# Patient Record
Sex: Male | Born: 1990 | Race: Black or African American | Hispanic: No | Marital: Single | Smoking: Former smoker
Health system: Southern US, Community
[De-identification: ages and names within clinical notes are randomized; demographics above are authoritative.]

---
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
## 2012-04-29 ENCOUNTER — Emergency Department: Payer: Self-pay | Admitting: Emergency Medicine

## 2012-06-05 ENCOUNTER — Emergency Department: Payer: Self-pay | Admitting: Emergency Medicine

## 2012-06-12 ENCOUNTER — Emergency Department: Payer: Self-pay | Admitting: Emergency Medicine

## 2012-06-12 LAB — URINALYSIS, COMPLETE
Bilirubin,UR: NEGATIVE
Blood: NEGATIVE
Leukocyte Esterase: NEGATIVE
Nitrite: NEGATIVE
Protein: 30
RBC,UR: 5 /HPF (ref 0–5)
Squamous Epithelial: NONE SEEN
Transitional Epi: <1

## 2013-11-20 ENCOUNTER — Emergency Department: Payer: Self-pay | Admitting: Emergency Medicine

## 2014-02-06 ENCOUNTER — Emergency Department: Payer: Self-pay | Admitting: Emergency Medicine

## 2014-02-06 LAB — CBC
HCT: 49.3 % (ref 40.0–52.0)
HGB: 16.6 g/dL (ref 13.0–18.0)
MCH: 29.3 pg (ref 26.0–34.0)
MCHC: 33.6 g/dL (ref 32.0–36.0)
MCV: 87 fL (ref 80–100)
Platelet: 266 10*3/uL (ref 150–440)
RBC: 5.64 10*6/uL (ref 4.40–5.90)
RDW: 13.4 % (ref 11.5–14.5)
WBC: 5.3 10*3/uL (ref 3.8–10.6)

## 2014-02-06 LAB — COMPREHENSIVE METABOLIC PANEL
ALBUMIN: 4.3 g/dL (ref 3.4–5.0)
ANION GAP: 5 — AB (ref 7–16)
AST: 22 U/L (ref 15–37)
Alkaline Phosphatase: 108 U/L
BUN: 8 mg/dL (ref 7–18)
Bilirubin,Total: 0.4 mg/dL (ref 0.2–1.0)
CALCIUM: 8.7 mg/dL (ref 8.5–10.1)
Chloride: 109 mmol/L — ABNORMAL HIGH (ref 98–107)
Co2: 25 mmol/L (ref 21–32)
Creatinine: 0.97 mg/dL (ref 0.60–1.30)
EGFR (African American): >60
EGFR (Non-African Amer.): >60
GLUCOSE: 113 mg/dL — AB (ref 65–99)
Osmolality: 277 (ref 275–301)
Potassium: 3.6 mmol/L (ref 3.5–5.1)
SGPT (ALT): 30 U/L (ref 12–78)
Sodium: 139 mmol/L (ref 136–145)
TOTAL PROTEIN: 8.1 g/dL (ref 6.4–8.2)

## 2014-02-06 LAB — ETHANOL
ETHANOL LVL: 243 mg/dL
Ethanol %: 0.243 % — ABNORMAL HIGH (ref 0.000–0.080)

## 2014-02-06 LAB — LIPASE, BLOOD: Lipase: 82 U/L (ref 73–393)

## 2015-06-18 ENCOUNTER — Emergency Department
Admission: EM | Admit: 2015-06-18 | Discharge: 2015-06-18 | Disposition: A | Discharge status: Home / Self Care | Payer: Self-pay | Attending: Emergency Medicine | Admitting: Emergency Medicine

## 2015-06-18 ENCOUNTER — Encounter: Payer: Self-pay | Admitting: Emergency Medicine

## 2015-06-18 DIAGNOSIS — Z87891 Personal history of nicotine dependence: Secondary | ICD-10-CM | POA: Insufficient documentation

## 2015-06-18 DIAGNOSIS — L0201 Cutaneous abscess of face: Secondary | ICD-10-CM | POA: Insufficient documentation

## 2015-06-18 MED ORDER — IBUPROFEN 800 MG PO TABS
800.0000 mg | ORAL_TABLET | Freq: Once | ORAL | Status: AC
  Administered 2015-06-18: 800 mg via ORAL

## 2015-06-18 MED ORDER — TRAMADOL HCL 50 MG PO TABS
ORAL_TABLET | ORAL | Status: AC
  Administered 2015-06-18: 50 mg via ORAL
  Filled 2015-06-18: qty 1

## 2015-06-18 MED ORDER — IBUPROFEN 800 MG PO TABS: 800.0000 mg | ORAL_TABLET | Freq: Three times a day (TID) | ORAL | Status: AC | PRN

## 2015-06-18 MED ORDER — SULFAMETHOXAZOLE-TRIMETHOPRIM 800-160 MG PO TABS: 1.0000 | ORAL_TABLET | Freq: Two times a day (BID) | ORAL | Status: AC

## 2015-06-18 MED ORDER — TRAMADOL HCL 50 MG PO TABS
50.0000 mg | ORAL_TABLET | Freq: Once | ORAL | Status: AC
  Administered 2015-06-18: 50 mg via ORAL

## 2015-06-18 MED ORDER — TRAMADOL HCL 50 MG PO TABS: 50.0000 mg | ORAL_TABLET | Freq: Four times a day (QID) | ORAL | Status: AC | PRN

## 2015-06-18 MED ORDER — SULFAMETHOXAZOLE-TRIMETHOPRIM 800-160 MG PO TABS
ORAL_TABLET | ORAL | Status: AC
  Administered 2015-06-18: 1 via ORAL
  Filled 2015-06-18: qty 1

## 2015-06-18 MED ORDER — IBUPROFEN 800 MG PO TABS
ORAL_TABLET | ORAL | Status: AC
  Administered 2015-06-18: 800 mg via ORAL
  Filled 2015-06-18: qty 1

## 2015-06-18 MED ORDER — SULFAMETHOXAZOLE-TRIMETHOPRIM 800-160 MG PO TABS
1.0000 | ORAL_TABLET | Freq: Two times a day (BID) | ORAL | Status: DC
  Administered 2015-06-18: 1 via ORAL

## 2015-06-18 NOTE — ED Notes (Addendum)
Redden area noted to chin with white drainage that started 2 days ago. Denies fever

## 2015-06-18 NOTE — ED Notes (Signed)
NAD noted at time of D/C. Pt denies questions or concerns. Pt ambulatory to the lobby at this time.  

## 2015-06-18 NOTE — Discharge Instructions (Signed)
Abscess °An abscess is an infected area that contains a collection of pus and debris. It can occur in almost any part of the body. An abscess is also known as a furuncle or boil. °CAUSES  °An abscess occurs when tissue gets infected. This can occur from blockage of oil or sweat glands, infection of hair follicles, or a minor injury to the skin. As the body tries to fight the infection, pus collects in the area and creates pressure under the skin. This pressure causes pain. People with weakened immune systems have difficulty fighting infections and get certain abscesses more often.  °SYMPTOMS °Usually an abscess develops on the skin and becomes a painful mass that is red, warm, and tender. If the abscess forms under the skin, you may feel a moveable soft area under the skin. Some abscesses break open (rupture) on their own, but most will continue to get worse without care. The infection can spread deeper into the body and eventually into the bloodstream, causing you to feel ill.  °DIAGNOSIS  °Your caregiver will take your medical history and perform a physical exam. A sample of fluid may also be taken from the abscess to determine what is causing your infection. °TREATMENT  °Your caregiver may prescribe antibiotic medicines to fight the infection. However, taking antibiotics alone usually does not cure an abscess. Your caregiver may need to make a small cut (incision) in the abscess to drain the pus. In some cases, gauze is packed into the abscess to reduce pain and to continue draining the area. °HOME CARE INSTRUCTIONS  °· Only take over-the-counter or prescription medicines for pain, discomfort, or fever as directed by your caregiver. °· If you were prescribed antibiotics, take them as directed. Finish them even if you start to feel better. °· If gauze is used, follow your caregiver's directions for changing the gauze. °· To avoid spreading the infection: °· Keep your draining abscess covered with a  bandage. °· Wash your hands well. °· Do not share personal care items, towels, or whirlpools with others. °· Avoid skin contact with others. °· Keep your skin and clothes clean around the abscess. °· Keep all follow-up appointments as directed by your caregiver. °SEEK MEDICAL CARE IF:  °· You have increased pain, swelling, redness, fluid drainage, or bleeding. °· You have muscle aches, chills, or a general ill feeling. °· You have a fever. °MAKE SURE YOU:  °· Understand these instructions. °· Will watch your condition. °· Will get help right away if you are not doing well or get worse. °Document Released: 09/26/2005 Document Revised: 06/17/2012 Document Reviewed: 02/29/2012 °ExitCare® Patient Information ©2015 [employer]. This information is not intended to replace advice given to you by your health care provider. Make sure you discuss any questions you have with your health care provider. ° °Abscess °Care After °An abscess (also called a boil or furuncle) is an infected area that contains a collection of pus. Signs and symptoms of an abscess include pain, tenderness, redness, or hardness, or you may feel a moveable soft area under your skin. An abscess can occur anywhere in the body. The infection may spread to surrounding tissues causing cellulitis. A cut (incision) by the surgeon was made over your abscess and the pus was drained out. Gauze may have been packed into the space to provide a drain that will allow the cavity to heal from the inside outwards. The boil may be painful for 5 to 7 days. Most people with a boil do not have   high fevers. Your abscess, if seen early, may not have localized, and may not have been lanced. If not, another appointment may be required for this if it does not get better on its own or with medications. °HOME CARE INSTRUCTIONS  °· Only take over-the-counter or prescription medicines for pain, discomfort, or fever as directed by your caregiver. °· When you bathe, soak and then  remove gauze or iodoform packs at least daily or as directed by your caregiver. You may then wash the wound gently with mild soapy water. Repack with gauze or do as your caregiver directs. °SEEK IMMEDIATE MEDICAL CARE IF:  °· You develop increased pain, swelling, redness, drainage, or bleeding in the wound site. °· You develop signs of generalized infection including muscle aches, chills, fever, or a general ill feeling. °· An oral temperature above 102° F (38.9° C) develops, not controlled by medication. °See your caregiver for a recheck if you develop any of the symptoms described above. If medications (antibiotics) were prescribed, take them as directed. °Document Released: 07/05/2005 Document Revised: 03/10/2012 Document Reviewed: 03/01/2008 °ExitCare® Patient Information ©2015 [employer]. This information is not intended to replace advice given to you by your health care provider. Make sure you discuss any questions you have with your health care provider. ° °

## 2015-06-18 NOTE — ED Provider Notes (Signed)
[facility] Emergency Department Provider Note  ____________________________________________  Time seen: 1710 I have reviewed the triage vital signs and the nursing notes.   HISTORY  Chief Complaint Abscess    HPI Joel Kirk is a 24 y.o. male since here today with a abscess on his chin states that he had a bump he squeezed it now his chin is swollen scabbed over nothing more is coming out of it and is here today thinking that he may have to get it cut open says it hurts rates it about 8 out of 10 nothing making empirically better or worse denies any other complaints at this time fevers chills nausea any trauma   History reviewed. No pertinent past medical history.  There are no active problems to display for this patient.   History reviewed. No pertinent past surgical history.  Current Outpatient Rx  Name  Route  Sig  Dispense  Refill  . ibuprofen (ADVIL,MOTRIN) 800 MG tablet   Oral   Take 1 tablet (800 mg total) by mouth every 8 (eight) hours as needed.   30 tablet   0   . sulfamethoxazole-trimethoprim (BACTRIM DS,SEPTRA DS) 800-160 MG per tablet   Oral   Take 1 tablet by mouth 2 (two) times daily.   14 tablet   0   . traMADol (ULTRAM) 50 MG tablet   Oral   Take 1 tablet (50 mg total) by mouth every 6 (six) hours as needed.   12 tablet   0     Allergies Review of patient's allergies indicates no known allergies.  No family history on file.  Social History History  Substance Use Topics  . Smoking status: Former Games developer  . Smokeless tobacco: Never Used  . Alcohol Use: Yes    Review of Systems Constitutional: No fever/chills Eyes: No visual changes. ENT: No sore throat. Cardiovascular: Denies chest pain. Respiratory: Denies shortness of breath. Gastrointestinal: No abdominal pain.  No nausea, no vomiting.  No diarrhea.  No constipation. Genitourinary: Negative for dysuria. Musculoskeletal: Negative for back pain. Skin:  Negative for rash. Neurological: Negative for headaches, focal weakness or numbness.  10-point ROS otherwise negative. Except for noted in the history of present illness  ____________________________________________   PHYSICAL EXAM:  VITAL SIGNS: ED Triage Vitals  Enc Vitals Group     BP 06/18/15 1658 140/74 mmHg     Pulse Rate 06/18/15 1658 82     Resp 06/18/15 1658 18     Temp 06/18/15 1658 98.7 F (37.1 C)     Temp Source 06/18/15 1658 Oral     SpO2 06/18/15 1658 97 %     Weight 06/18/15 1658 165 lb (74.844 kg)     Height 06/18/15 1658  (1.753 m)     Head Cir --      Peak Flow --      Pain Score 06/18/15 1700 8     Pain Loc --      Pain Edu? --      Excl. in GC? --     Constitutional: Alert and oriented. Well appearing and in no acute distress. Eyes: Conjunctivae are normal. PERRL. EOMI. Head: Atraumatic. Nose: No congestion/rhinnorhea. Mouth/Throat: Mucous membranes are moist.  Oropharynx non-erythematous. Neck: No stridor.   Cardiovascular: Normal rate, regular rhythm. Grossly normal heart sounds.  Good peripheral circulation. Respiratory: Normal respiratory effort.  No retractions. Lungs CTAB.  Musculoskeletal: No lower extremity tenderness nor edema.  No joint effusions. Neurologic:  Normal speech  and language. No gross focal neurologic deficits are appreciated. Speech is normal. No gait instability. Skin:  Skin is warm, dry and intact. Patient has swelling to the left side of his chin clear drainage redness and mild fluctuants scabbed over Psychiatric: Mood and affect are normal. Speech and behavior are normal.  ____________________________________________     PROCEDURES  Procedure(s) performed: None  Critical Care performed: No  ____________________________________________   INITIAL IMPRESSION / ASSESSMENT AND PLAN / ED COURSE  Pertinent labs & imaging results that were available during my care of the patient were reviewed by me and considered  in my medical decision making (see chart for details).  Area was cleaned with alcohol scab was removed with mild palpation of fair amount. Material was discharged look to be an ingrown hair and start the patient on Tobi Bastos biotics recommend warm compresses keep area clean return here for any acute concerns or worsening symptoms ____________________________________________   FINAL CLINICAL IMPRESSION(S) / ED DIAGNOSES  Final diagnoses:  Facial abscess     Shawnte Winton Rosalyn Gess, PA-C 06/18/15 1811  Sharyn Creamer, MD 06/19/15 0013

## 2015-10-07 ENCOUNTER — Emergency Department
Admission: EM | Admit: 2015-10-07 | Discharge: 2015-10-07 | Disposition: A | Discharge status: Home / Self Care | Payer: Self-pay | Attending: Emergency Medicine | Admitting: Emergency Medicine

## 2015-10-07 ENCOUNTER — Encounter: Payer: Self-pay | Admitting: Emergency Medicine

## 2015-10-07 DIAGNOSIS — A084 Viral intestinal infection, unspecified: Secondary | ICD-10-CM | POA: Insufficient documentation

## 2015-10-07 DIAGNOSIS — Z87891 Personal history of nicotine dependence: Secondary | ICD-10-CM | POA: Insufficient documentation

## 2015-10-07 DIAGNOSIS — Z79899 Other long term (current) drug therapy: Secondary | ICD-10-CM | POA: Insufficient documentation

## 2015-10-07 LAB — CBC
HEMATOCRIT: 48.8 % (ref 40.0–52.0)
HEMOGLOBIN: 16.3 g/dL (ref 13.0–18.0)
MCH: 27.4 pg (ref 26.0–34.0)
MCHC: 33.4 g/dL (ref 32.0–36.0)
MCV: 82 fL (ref 80.0–100.0)
Platelets: 297 10*3/uL (ref 150–440)
RBC: 5.95 MIL/uL — ABNORMAL HIGH (ref 4.40–5.90)
RDW: 12.9 % (ref 11.5–14.5)
WBC: 9.6 10*3/uL (ref 3.8–10.6)

## 2015-10-07 LAB — URINALYSIS COMPLETE WITH MICROSCOPIC (ARMC ONLY)
BACTERIA UA: NONE SEEN
Bilirubin Urine: NEGATIVE
Glucose, UA: NEGATIVE mg/dL
Hgb urine dipstick: NEGATIVE
Leukocytes, UA: NEGATIVE
Nitrite: NEGATIVE
PH: 5 (ref 5.0–8.0)
Protein, ur: 100 mg/dL — AB
RBC / HPF: NONE SEEN RBC/hpf (ref 0–5)
SQUAMOUS EPITHELIAL / LPF: NONE SEEN
Specific Gravity, Urine: 1.029 (ref 1.005–1.030)

## 2015-10-07 LAB — COMPREHENSIVE METABOLIC PANEL
ALBUMIN: 5.4 g/dL — AB (ref 3.5–5.0)
ALT: 21 U/L (ref 17–63)
ANION GAP: 16 — AB (ref 5–15)
AST: 20 U/L (ref 15–41)
Alkaline Phosphatase: 101 U/L (ref 38–126)
BUN: 12 mg/dL (ref 6–20)
CHLORIDE: 103 mmol/L (ref 101–111)
CO2: 17 mmol/L — AB (ref 22–32)
Calcium: 9.9 mg/dL (ref 8.9–10.3)
Creatinine, Ser: 0.91 mg/dL (ref 0.61–1.24)
GFR calc non Af Amer: >60 mL/min (ref >60)
GLUCOSE: 83 mg/dL (ref 65–99)
Potassium: 4.4 mmol/L (ref 3.5–5.1)
SODIUM: 136 mmol/L (ref 135–145)
Total Bilirubin: 2.1 mg/dL — ABNORMAL HIGH (ref 0.3–1.2)
Total Protein: 8.3 g/dL — ABNORMAL HIGH (ref 6.5–8.1)

## 2015-10-07 LAB — LIPASE, BLOOD: LIPASE: 19 U/L — AB (ref 22–51)

## 2015-10-07 MED ORDER — ONDANSETRON 4 MG PO TBDP
4.0000 mg | ORAL_TABLET | Freq: Once | ORAL | Status: AC
  Administered 2015-10-07: 4 mg via ORAL
  Filled 2015-10-07: qty 1

## 2015-10-07 MED ORDER — ONDANSETRON 4 MG PO TBDP: 4.0000 mg | ORAL_TABLET | Freq: Three times a day (TID) | ORAL | Status: AC | PRN

## 2015-10-07 MED ORDER — SODIUM CHLORIDE 0.9 % IV BOLUS (SEPSIS)
1000.0000 mL | Freq: Once | INTRAVENOUS | Status: AC
  Administered 2015-10-07: 1000 mL via INTRAVENOUS

## 2015-10-07 MED ORDER — ONDANSETRON HCL 4 MG/2ML IJ SOLN
4.0000 mg | Freq: Once | INTRAMUSCULAR | Status: AC
  Administered 2015-10-07: 4 mg via INTRAVENOUS
  Filled 2015-10-07: qty 2

## 2015-10-07 NOTE — Discharge Instructions (Signed)
Viral Gastroenteritis Viral gastroenteritis is also called stomach flu. This illness is caused by a certain type of germ (virus). It can cause sudden watery poop (diarrhea) and throwing up (vomiting). This can cause you to lose body fluids (dehydration). This illness usually lasts for 3 to 8 days. It usually goes away on its own. HOME CARE   Drink enough fluids to keep your pee (urine) clear or pale yellow. Drink small amounts of fluids often.  Ask your doctor how to replace body fluid losses (rehydration).  Avoid:  Foods high in sugar.  Alcohol.  Bubbly (carbonated) drinks.  Tobacco.  Juice.  Caffeine drinks.  Very hot or cold fluids.  Fatty, greasy foods.  Eating too much at one time.  Dairy products until 24 to 48 hours after your watery poop stops.  You may eat foods with active cultures (probiotics). They can be found in some yogurts and supplements.  Wash your hands well to avoid spreading the illness.  Only take medicines as told by your doctor. Do not give aspirin to children. Do not take medicines for watery poop (antidiarrheals).  Ask your doctor if you should keep taking your regular medicines.  Keep all doctor visits as told. GET HELP RIGHT AWAY IF:   You cannot keep fluids down.  You do not pee at least once every 6 to 8 hours.  You are short of breath.  You see blood in your poop or throw up. This may look like coffee grounds.  You have belly (abdominal) pain that gets worse or is just in one small spot (localized).  You keep throwing up or having watery poop.  You have a fever.  The patient is a child younger than 3 months, and he or she has a fever.  The patient is a child older than 3 months, and he or she has a fever and problems that do not go away.  The patient is a child older than 3 months, and he or she has a fever and problems that suddenly get worse.  The patient is a baby, and he or she has no tears when crying. MAKE SURE YOU:     Understand these instructions.  Will watch your condition.  Will get help right away if you are not doing well or get worse.   This information is not intended to replace advice given to you by your health care provider. Make sure you discuss any questions you have with your health care provider.   Document Released: 06/04/2008 Document Revised: 03/10/2012 Document Reviewed: 10/03/2011 Elsevier Interactive Patient Education 2016 [employer]     Clear liquids for the next 24 hours. Safely as needed for nausea. Follow-up with your doctor or return to the emergency room if any continued symptoms. He may also take Tylenol if needed for fever or aches.

## 2015-10-07 NOTE — ED Notes (Signed)
Pt to ed with c/o n/v/d since yesterday.

## 2015-10-07 NOTE — ED Provider Notes (Signed)
[facility] Emergency Department Provider Note  ____________________________________________  Time seen: Approximately 3:33 PM  I have reviewed the triage vital signs and the nursing notes.   HISTORY  Chief Complaint Emesis  HPI Joel Kirk is a 24 y.o. male is here complaining of nausea, vomiting, and diarrhea since yesterday.Patient states that he works night shift and ate dinner approximately 2:30 AM. At 5 AM today he began having vomiting and vomited 4-5 times prior to going to sleep. When he awoke he continued to have nausea and vomiting. He has had diarrhea 2-3 times today. He denies any other family members having same symptoms. He is unaware of any fever or chills. He has not taken any over-the-counter medication and has not eaten since he woke up his afternoon. Currently he rates his discomfort as 8 out of 10. He denies any abdominal pain but "still feels nauseous".   History reviewed. No pertinent past medical history.  There are no active problems to display for this patient.   History reviewed. No pertinent past surgical history.  Current Outpatient Rx  Name  Route  Sig  Dispense  Refill  . ibuprofen (ADVIL,MOTRIN) 800 MG tablet   Oral   Take 1 tablet (800 mg total) by mouth every 8 (eight) hours as needed.   30 tablet   0   . ondansetron (ZOFRAN ODT) 4 MG disintegrating tablet   Oral   Take 1 tablet (4 mg total) by mouth every 8 (eight) hours as needed for nausea or vomiting.   20 tablet   0   . sulfamethoxazole-trimethoprim (BACTRIM DS,SEPTRA DS) 800-160 MG per tablet   Oral   Take 1 tablet by mouth 2 (two) times daily.   14 tablet   0   . traMADol (ULTRAM) 50 MG tablet   Oral   Take 1 tablet (50 mg total) by mouth every 6 (six) hours as needed.   12 tablet   0     Allergies Review of patient's allergies indicates no known allergies.  History reviewed. No pertinent family history.  Social History Social History   Substance Use Topics  . Smoking status: Former Games developer  . Smokeless tobacco: Never Used  . Alcohol Use: Yes    Review of Systems Constitutional: No fever/chills ENT: Mildly positive sore throat. Cardiovascular: Denies chest pain. Respiratory: Denies shortness of breath. Gastrointestinal: No abdominal pain.  Positive nausea, positive vomiting.  Positive diarrhea.  No constipation. Genitourinary: Negative for dysuria. Musculoskeletal: Negative for back pain. Skin: Negative for rash. Neurological: Negative for headaches, focal weakness or numbness.  10-point ROS otherwise negative.  ____________________________________________   PHYSICAL EXAM:  VITAL SIGNS: ED Triage Vitals  Enc Vitals Group     BP 10/07/15 1449 151/87 mmHg     Pulse Rate 10/07/15 1449 92     Resp 10/07/15 1449 20     Temp 10/07/15 1449 98.5 F (36.9 C)     Temp Source 10/07/15 1449 Oral     SpO2 10/07/15 1449 97 %     Weight 10/07/15 1449 165 lb (74.844 kg)     Height 10/07/15 1449  (1.753 m)     Head Cir --      Peak Flow --      Pain Score 10/07/15 1449 8     Pain Loc --      Pain Edu? --      Excl. in GC? --     Constitutional: Alert and oriented. Well appearing and  in no acute distress. Eyes: Conjunctivae are normal. PERRL. EOMI. Head: Atraumatic. Nose: No congestion/rhinnorhea. Mouth/Throat: Mucous membranes are moist.  Oropharynx non-erythematous. Neck: No stridor.   Cardiovascular: Normal rate, regular rhythm. Grossly normal heart sounds.  Good peripheral circulation. Respiratory: Normal respiratory effort.  No retractions. Lungs CTAB. Gastrointestinal: Soft and nontender. No distention. No abdominal bruits. No CVA tenderness. Bowel sounds at present are hypoactive. Musculoskeletal: No lower extremity tenderness nor edema.  No joint effusions. Neurologic:  Normal speech and language. No gross focal neurologic deficits are appreciated. No gait instability. Skin:  Skin is warm, dry and  intact. No rash noted. Psychiatric: Mood and affect are normal. Speech and behavior are normal.  ____________________________________________   LABS (all labs ordered are listed, but only abnormal results are displayed)  Labs Reviewed  LIPASE, BLOOD - Abnormal; Notable for the following:    Lipase 19 (*)    All other components within normal limits  COMPREHENSIVE METABOLIC PANEL - Abnormal; Notable for the following:    CO2 17 (*)    Total Protein 8.3 (*)    Albumin 5.4 (*)    Total Bilirubin 2.1 (*)    Anion gap 16 (*)    All other components within normal limits  CBC - Abnormal; Notable for the following:    RBC 5.95 (*)    All other components within normal limits  URINALYSIS COMPLETEWITH MICROSCOPIC ([facility] ONLY) - Abnormal; Notable for the following:    Color, Urine YELLOW (*)    APPearance CLEAR (*)    Ketones, ur 2+ (*)    Protein, ur 100 (*)    All other components within normal limits     PROCEDURES  Procedure(s) performed: None  Critical Care performed: No  ____________________________________________   INITIAL IMPRESSION / ASSESSMENT AND PLAN / ED COURSE  Pertinent labs & imaging results that were available during my care of the patient were reviewed by me and considered in my medical decision making (see chart for details).  Patient got a bolus of normal saline along with Zofran. Later he was able to drink fluids with out any nausea. Patient was discharged on Zofran. He is also given a note for work. He is return to the emergency room if any severe worsening of his symptoms or inability to take his medication. ____________________________________________   FINAL CLINICAL IMPRESSION(S) / ED DIAGNOSES  Final diagnoses:  Viral gastroenteritis      Tommi Rumps, PA-C 10/07/15 1744  Richardean Canal, MD 10/11/15 [phone]

## 2016-10-19 ENCOUNTER — Emergency Department: Payer: Self-pay

## 2016-10-19 ENCOUNTER — Encounter: Payer: Self-pay | Admitting: *Deleted

## 2016-10-19 ENCOUNTER — Emergency Department
Admission: EM | Admit: 2016-10-19 | Discharge: 2016-10-19 | Disposition: A | Discharge status: Home / Self Care | Payer: Self-pay | Attending: Emergency Medicine | Admitting: Emergency Medicine

## 2016-10-19 DIAGNOSIS — Y929 Unspecified place or not applicable: Secondary | ICD-10-CM | POA: Insufficient documentation

## 2016-10-19 DIAGNOSIS — Y9389 Activity, other specified: Secondary | ICD-10-CM | POA: Insufficient documentation

## 2016-10-19 DIAGNOSIS — Z87891 Personal history of nicotine dependence: Secondary | ICD-10-CM | POA: Insufficient documentation

## 2016-10-19 DIAGNOSIS — Z79899 Other long term (current) drug therapy: Secondary | ICD-10-CM | POA: Insufficient documentation

## 2016-10-19 DIAGNOSIS — Y990 Civilian activity done for income or pay: Secondary | ICD-10-CM | POA: Insufficient documentation

## 2016-10-19 DIAGNOSIS — W208XXA Other cause of strike by thrown, projected or falling object, initial encounter: Secondary | ICD-10-CM | POA: Insufficient documentation

## 2016-10-19 DIAGNOSIS — S60221A Contusion of right hand, initial encounter: Secondary | ICD-10-CM | POA: Insufficient documentation

## 2016-10-19 MED ORDER — IBUPROFEN 800 MG PO TABS
800.0000 mg | ORAL_TABLET | Freq: Once | ORAL | Status: AC
  Administered 2016-10-19: 800 mg via ORAL
  Filled 2016-10-19: qty 1

## 2016-10-19 MED ORDER — IBUPROFEN 800 MG PO TABS: 800.0000 mg | ORAL_TABLET | Freq: Three times a day (TID) | ORAL | 0 refills | Status: AC | PRN

## 2016-10-19 NOTE — ED Triage Notes (Signed)
Pt was working and a piece of wood fell and smashed his right hand. C/o right hand pain and swelling. Took tylenol around 3pm, with some relief.

## 2016-10-19 NOTE — ED Provider Notes (Signed)
[facility] Emergency Department Provider Note    First MD Initiated Contact with Patient 10/19/16 [phone]     (approximate)  I have reviewed the triage vital signs and the nursing notes.   HISTORY  Chief Complaint Hand Pain    HPI Joel Kirk is a 25 y.o. male resents with right hand pain after being struck on the posterior portion of the right hand by a large piece of wood. Patient states that he took Tylenol approximately 3 PM this afternoon with some relief.   Past medical history None There are no active problems to display for this patient.  Past surgical history None  Prior to Admission medications   Medication Sig Start Date End Date Taking? Authorizing Provider  ibuprofen (ADVIL,MOTRIN) 800 MG tablet Take 1 tablet (800 mg total) by mouth every 8 (eight) hours as needed. 06/18/15   III William C Ruffian, PA-C  ondansetron (ZOFRAN ODT) 4 MG disintegrating tablet Take 1 tablet (4 mg total) by mouth every 8 (eight) hours as needed for nausea or vomiting. 10/07/15   Tommi Rumps, PA-C  sulfamethoxazole-trimethoprim (BACTRIM DS,SEPTRA DS) 800-160 MG per tablet Take 1 tablet by mouth 2 (two) times daily. 06/18/15   III Kristine Garbe Ruffian, PA-C  traMADol (ULTRAM) 50 MG tablet Take 1 tablet (50 mg total) by mouth every 6 (six) hours as needed. 06/18/15   III Kristine Garbe Ruffian, PA-C    Allergies No known drug allergies No family history on file.  Social History Social History  Substance Use Topics  . Smoking status: Former Games developer  . Smokeless tobacco: Never Used  . Alcohol use Yes    Review of Systems Constitutional: No fever/chills Eyes: No visual changes. ENT: No sore throat. Cardiovascular: Denies chest pain. Respiratory: Denies shortness of breath. Gastrointestinal: No abdominal pain.  No nausea, no vomiting.  No diarrhea.  No constipation. Genitourinary: Negative for dysuria. Musculoskeletal: Negative for back pain.Positive for right  hand pain Skin: Negative for rash. Neurological: Negative for headaches, focal weakness or numbness.  10-point ROS otherwise negative.  ____________________________________________   PHYSICAL EXAM:  VITAL SIGNS: ED Triage Vitals [10/19/16 0009]  Enc Vitals Group     BP 139/89     Pulse Rate 74     Resp 16     Temp 97.7 F (36.5 C)     Temp Source Oral     SpO2 100 %     Weight 155 lb (70.3 kg)     Height 5\' 9"  (1.753 m)     Head Circumference      Peak Flow      Pain Score 8     Pain Loc      Pain Edu?      Excl. in GC?     Constitutional: Alert and oriented. Well appearing and in no acute distress. Eyes: Conjunctivae are normal. PERRL. EOMI. Head: Atraumatic. Mouth/Throat: Mucous membranes are moist.  Oropharynx non-erythematous. Neck: No stridor.  No meningeal signs.   Cardiovascular: Normal rate, regular rhythm. Good peripheral circulation. Grossly normal heart sounds. Respiratory: Normal respiratory effort.  No retractions. Lungs CTAB. Musculoskeletal: Swelling noted at the right fifth metacarpal with tenderness to palpation.. Neurologic:  Normal speech and language. No gross focal neurologic deficits are appreciated.  Skin:  Skin is warm, dry and intact. No rash noted. Psychiatric: Mood and affect are normal. Speech and behavior are normal. ____________________________________________  RADIOLOGY I, Heeia N Hermione Havlicek, personally viewed and evaluated these images (plain radiographs)  as part of my medical decision making, as well as reviewing the written report by the radiologist.  Dg Hand Complete Right  Result Date: 10/19/2016 CLINICAL DATA:  25 year old male with right hand swelling and pain. EXAM: RIGHT HAND - COMPLETE 3+ VIEW COMPARISON:  None. FINDINGS: There is no acute fracture or dislocation. The bones are well mineralized. No arthritic changes. There is diffuse soft tissue swelling of the hand. No radiopaque foreign object identified. IMPRESSION: No acute  osseous pathology. Diffuse soft tissue swelling of the hand. Clinical correlation is recommended. Electronically Signed   By: Elgie CollardArash  Radparvar M.D.   On: 10/19/2016 01:28     Procedures     INITIAL IMPRESSION / ASSESSMENT AND PLAN / ED COURSE  Pertinent labs & imaging results that were available during my care of the patient were reviewed by me and considered in my medical decision making (see chart for details).  X-ray of the right hand revealed soft tissue swelling which is consistent with physical exam. However x-ray did not reveal any evidence of fracture. Ice pack applied to the patient's right hand ibuprofen 800 mg given will be prescribed same for home.   Clinical Course    ____________________________________________  FINAL CLINICAL IMPRESSION(S) / ED DIAGNOSES  Final diagnoses:  Contusion of right hand, initial encounter     MEDICATIONS GIVEN DURING THIS VISIT:  Medications  ibuprofen (ADVIL,MOTRIN) tablet 800 mg (800 mg Oral Given 10/19/16 0209)     NEW OUTPATIENT MEDICATIONS STARTED DURING THIS VISIT:  New Prescriptions   No medications on file    Modified Medications   No medications on file    Discontinued Medications   No medications on file     Note:  This document was prepared using Dragon voice recognition software and may include unintentional dictation errors.    Darci Currentandolph N Dominik Yordy, MD 10/19/16 Earle [id number]

## 2016-10-19 NOTE — ED Notes (Signed)
Ice applied to right hand.

## 2016-12-03 ENCOUNTER — Emergency Department
Admission: EM | Admit: 2016-12-03 | Discharge: 2016-12-03 | Disposition: A | Discharge status: Home / Self Care | Payer: Self-pay | Attending: Emergency Medicine | Admitting: Emergency Medicine

## 2016-12-03 ENCOUNTER — Encounter: Payer: Self-pay | Admitting: Medical Oncology

## 2016-12-03 DIAGNOSIS — Z87891 Personal history of nicotine dependence: Secondary | ICD-10-CM | POA: Insufficient documentation

## 2016-12-03 DIAGNOSIS — N390 Urinary tract infection, site not specified: Secondary | ICD-10-CM | POA: Insufficient documentation

## 2016-12-03 LAB — URINALYSIS COMPLETE WITH MICROSCOPIC (ARMC ONLY)
BILIRUBIN URINE: NEGATIVE
Bacteria, UA: NONE SEEN
GLUCOSE, UA: NEGATIVE mg/dL
HGB URINE DIPSTICK: NEGATIVE
Ketones, ur: NEGATIVE mg/dL
Nitrite: NEGATIVE
Protein, ur: NEGATIVE mg/dL
SPECIFIC GRAVITY, URINE: 1.025 (ref 1.005–1.030)
Squamous Epithelial / LPF: NONE SEEN
pH: 5 (ref 5.0–8.0)

## 2016-12-03 LAB — CHLAMYDIA/NGC RT PCR (ARMC ONLY)
Chlamydia Tr: NOT DETECTED
N GONORRHOEAE: NOT DETECTED

## 2016-12-03 MED ORDER — PHENAZOPYRIDINE HCL 200 MG PO TABS: 200.0000 mg | ORAL_TABLET | Freq: Three times a day (TID) | ORAL | 0 refills | Status: AC | PRN

## 2016-12-03 MED ORDER — SULFAMETHOXAZOLE-TRIMETHOPRIM 800-160 MG PO TABS: 1.0000 | ORAL_TABLET | Freq: Two times a day (BID) | ORAL | 0 refills | Status: AC

## 2016-12-03 NOTE — ED Notes (Signed)
Resting at present   Awaiting lab results

## 2016-12-03 NOTE — ED Triage Notes (Signed)
Pt to ed with c/o urinary frequency and burning.

## 2016-12-03 NOTE — ED Notes (Signed)
See triage note  States he is having some diff with urination denies any penile discharge  But thinks he has been exposed to STD

## 2016-12-03 NOTE — ED Provider Notes (Signed)
[facility] Emergency Department Provider Note   ____________________________________________   First MD Initiated Contact with Patient 12/03/16 [phone]     (approximate)  I have reviewed the triage vital signs and the nursing notes.   HISTORY  Chief Complaint Dysuria    HPI Clyde Canterburyyler L Corea is a 25 y.o. male patient complaining the urinary frequency and burning for 3 days. Patient say suspect STD but discomfort as test negative. Patient denies any urethral discharge. Patient denies any flank pain. Patient denies any fever.Patient rates his pain discomfort 6/10. No palliative measures for this complaint.   History reviewed. No pertinent past medical history.  There are no active problems to display for this patient.   History reviewed. No pertinent surgical history.  Prior to Admission medications   Medication Sig Start Date End Date Taking? Authorizing Provider  ibuprofen (ADVIL,MOTRIN) 800 MG tablet Take 1 tablet (800 mg total) by mouth every 8 (eight) hours as needed. 06/18/15   III William C Ruffian, PA-C  ibuprofen (ADVIL,MOTRIN) 800 MG tablet Take 1 tablet (800 mg total) by mouth every 8 (eight) hours as needed. 10/19/16   Darci Currentandolph N Brown, MD  ondansetron (ZOFRAN ODT) 4 MG disintegrating tablet Take 1 tablet (4 mg total) by mouth every 8 (eight) hours as needed for nausea or vomiting. 10/07/15   Tommi Rumpshonda L Summers, PA-C  sulfamethoxazole-trimethoprim (BACTRIM DS,SEPTRA DS) 800-160 MG per tablet Take 1 tablet by mouth 2 (two) times daily. 06/18/15   III Kristine GarbeWilliam C Ruffian, PA-C  traMADol (ULTRAM) 50 MG tablet Take 1 tablet (50 mg total) by mouth every 6 (six) hours as needed. 06/18/15   III Rosalyn GessWilliam C Ruffian, PA-C    Allergies Patient has no known allergies.  No family history on file.  Social History Social History  Substance Use Topics  . Smoking status: Former Games developermoker  . Smokeless tobacco: Never Used  . Alcohol use Yes    Review of  Systems Constitutional: No fever/chills Eyes: No visual changes. ENT: No sore throat. Cardiovascular: Denies chest pain. Respiratory: Denies shortness of breath. Gastrointestinal: No abdominal pain.  No nausea, no vomiting.  No diarrhea.  No constipation. Genitourinary: Positive for dysuria. Musculoskeletal: Negative for back pain. Skin: Negative for rash. Neurological: Negative for headaches, focal weakness or numbness.    ____________________________________________   PHYSICAL EXAM:  VITAL SIGNS: ED Triage Vitals  Enc Vitals Group     BP 12/03/16 0822 126/75     Pulse Rate 12/03/16 0822 72     Resp 12/03/16 0822 18     Temp 12/03/16 0822 98.2 F (36.8 C)     Temp Source 12/03/16 0822 Oral     SpO2 12/03/16 0822 97 %     Weight 12/03/16 0820 155 lb (70.3 kg)     Height 12/03/16 0820 5\' 9"  (1.753 m)     Head Circumference --      Peak Flow --      Pain Score 12/03/16 0820 6     Pain Loc --      Pain Edu? --      Excl. in GC? --     Constitutional: Alert and oriented. Well appearing and in no acute distress. Eyes: Conjunctivae are normal. PERRL. EOMI. Head: Atraumatic. Nose: No congestion/rhinnorhea. Mouth/Throat: Mucous membranes are moist.  Oropharynx non-erythematous. Neck: No stridor. No cervical spine tenderness to palpation. Hematological/Lymphatic/Immunilogical: No cervical lymphadenopathy. Cardiovascular: Normal rate, regular rhythm. Grossly normal heart sounds.  Good peripheral circulation. Respiratory: Normal respiratory effort.  No retractions. Lungs CTAB. Gastrointestinal: Soft and nontender. No distention. No abdominal bruits. No CVA tenderness. Genitourinary: No penis lesions or obvious discharge. No inguinal adenopathy. Musculoskeletal: No lower extremity tenderness nor edema.  No joint effusions. Neurologic:  Normal speech and language. No gross focal neurologic deficits are appreciated. No gait instability. Skin:  Skin is warm, dry and intact. No  rash noted. Psychiatric: Mood and affect are normal. Speech and behavior are normal.  ____________________________________________   LABS (all labs ordered are listed, but only abnormal results are displayed)  Labs Reviewed - No data to display ____________________________________________  EKG   ____________________________________________  RADIOLOGY   ____________________________________________   PROCEDURES  Procedure(s) performed: None  Procedures  Critical Care performed: No  ____________________________________________   INITIAL IMPRESSION / ASSESSMENT AND PLAN / ED COURSE  Pertinent labs & imaging results that were available during my care of the patient were reviewed by me and considered in my medical decision making (see chart for details).  Urinary tract infection. Discussed negative chlamydia and gonorrhea results with patient. Patient given discharge care instructions and a prescription for Bactrim DS and Pyridium. He is advised to follow "clinic if condition persists.  Clinical Course      ____________________________________________   FINAL CLINICAL IMPRESSION(S) / ED DIAGNOSES  Final diagnoses:  None      NEW MEDICATIONS STARTED DURING THIS VISIT:  New Prescriptions   No medications on file     Note:  This document was prepared using Dragon voice recognition software and may include unintentional dictation errors.    Joni Reiningonald K Genese Quebedeaux, PA-C 12/03/16 1159    Rockne MenghiniAnne-Caroline Norman, MD 12/03/16 [phone]

## 2018-02-14 ENCOUNTER — Other Ambulatory Visit: Payer: Self-pay

## 2018-02-14 ENCOUNTER — Emergency Department
Admission: EM | Admit: 2018-02-14 | Discharge: 2018-02-14 | Disposition: A | Discharge status: Home / Self Care | Payer: Self-pay | Attending: Emergency Medicine | Admitting: Emergency Medicine

## 2018-02-14 ENCOUNTER — Emergency Department: Payer: Self-pay

## 2018-02-14 DIAGNOSIS — Z87891 Personal history of nicotine dependence: Secondary | ICD-10-CM | POA: Insufficient documentation

## 2018-02-14 DIAGNOSIS — J029 Acute pharyngitis, unspecified: Secondary | ICD-10-CM | POA: Insufficient documentation

## 2018-02-14 DIAGNOSIS — B349 Viral infection, unspecified: Secondary | ICD-10-CM | POA: Insufficient documentation

## 2018-02-14 LAB — GROUP A STREP BY PCR: Group A Strep by PCR: NOT DETECTED

## 2018-02-14 NOTE — ED Notes (Signed)
No answer when called several times from lobby 

## 2018-02-14 NOTE — ED Notes (Signed)
Patient transported to X-ray 

## 2018-02-14 NOTE — ED Provider Notes (Signed)
[facility] Emergency Department Provider Note  ____________________________________________   First MD Initiated Contact with Patient 02/14/18 [phone]     (approximate)  I have reviewed the triage vital signs and the nursing notes.   HISTORY  Chief Complaint Sore Throat    HPI Joel Kirk is a 27 y.o. male with no chronic medical issues who presents for evaluation of sore throat.  He states it started about 2 days ago and has been constant.  It is not getting significantly worse and he describes as moderate.  He feels it in the middle left side of his throat and he says that it makes it difficult for him to swallow because of the pain.  He denies any other symptoms except for a mild cough recently.  He denies fever/chills, chest pain, shortness of breath, nausea, vomiting, abdominal pain.  He has not noticed any swelling in his mouth or throat and has no dental issues of which she is aware.  He is not having any difficulty talking or handling his secretions.  History reviewed. No pertinent past medical history.  There are no active problems to display for this patient.   History reviewed. No pertinent surgical history.  Prior to Admission medications   Medication Sig Start Date End Date Taking? Authorizing Provider  ibuprofen (ADVIL,MOTRIN) 800 MG tablet Take 1 tablet (800 mg total) by mouth every 8 (eight) hours as needed. 06/18/15   Ruffian, III Kristine Garbe, PA-C  ibuprofen (ADVIL,MOTRIN) 800 MG tablet Take 1 tablet (800 mg total) by mouth every 8 (eight) hours as needed. 10/19/16   Darci Current, MD  ondansetron (ZOFRAN ODT) 4 MG disintegrating tablet Take 1 tablet (4 mg total) by mouth every 8 (eight) hours as needed for nausea or vomiting. 10/07/15   Tommi Rumps, PA-C  phenazopyridine (PYRIDIUM) 200 MG tablet Take 1 tablet (200 mg total) by mouth 3 (three) times daily as needed for pain. 12/03/16   Joni Reining, PA-C    sulfamethoxazole-trimethoprim (BACTRIM DS,SEPTRA DS) 800-160 MG per tablet Take 1 tablet by mouth 2 (two) times daily. 06/18/15   Ruffian, III Kristine Garbe, PA-C  sulfamethoxazole-trimethoprim (BACTRIM DS,SEPTRA DS) 800-160 MG tablet Take 1 tablet by mouth 2 (two) times daily. 12/03/16   Joni Reining, PA-C  traMADol (ULTRAM) 50 MG tablet Take 1 tablet (50 mg total) by mouth every 6 (six) hours as needed. 06/18/15   Garrel Ridgel, PA-C    Allergies Patient has no known allergies.  No family history on file.  Social History Social History   Tobacco Use  . Smoking status: Former Games developer  . Smokeless tobacco: Never Used  Substance Use Topics  . Alcohol use: Yes  . Drug use: No    Review of Systems Constitutional: No fever/chills Eyes: No visual changes. ENT: sore throat, pain in the middle left part of his neck/throat Cardiovascular: Denies chest pain. Respiratory: Denies shortness of breath. Mild cough recently Gastrointestinal: No abdominal pain.  No nausea, no vomiting.  No diarrhea.  No constipation. Genitourinary: Negative for dysuria. Musculoskeletal: Negative for neck pain.  Negative for back pain. Integumentary: Negative for rash. Neurological: Negative for headaches, focal weakness or numbness.   ____________________________________________   PHYSICAL EXAM:  VITAL SIGNS: ED Triage Vitals  Enc Vitals Group     BP 02/14/18 0015 124/83     Pulse Rate 02/14/18 0015 90     Resp 02/14/18 0015 17     Temp 02/14/18 0015 97.6  F (36.4 C)     Temp Source 02/14/18 0015 Oral     SpO2 02/14/18 0015 99 %     Weight 02/14/18 0015 68 kg (150 lb)     Height 02/14/18 0015 1.753 m (5\' 9" )     Head Circumference --      Peak Flow --      Pain Score 02/14/18 0023 8     Pain Loc --      Pain Edu? --      Excl. in GC? --     Constitutional: Alert and oriented. Well appearing and in no acute distress. Eyes: Conjunctivae are normal.  Head: Atraumatic. Nose: No  congestion/rhinnorhea. Mouth/Throat: Mucous membranes are moist.  Oropharynx non-erythematous. No evidence of peritonsillar abscess ,  Ludwig's angina, or other acute bacterial pharyngitis or abscess/infectious process Neck: No stridor.  No meningeal signs.  No tenderness to palpation nor manipulation of the larynx.  No anterior lymphadenopathy or submandibular induration/fluctuance Cardiovascular: Normal rate, regular rhythm. Good peripheral circulation. Grossly normal heart sounds. Respiratory: Normal respiratory effort.  No retractions. Lungs CTAB. Gastrointestinal: Soft and nontender. No distention.  Musculoskeletal: No lower extremity tenderness nor edema. No gross deformities of extremities. Neurologic:  Normal speech and language. No gross focal neurologic deficits are appreciated.  Skin:  Skin is warm, dry and intact. No rash noted. Psychiatric: Mood and affect are normal. Speech and behavior are normal.  ____________________________________________   LABS (all labs ordered are listed, but only abnormal results are displayed)  Labs Reviewed  GROUP A STREP BY PCR   ____________________________________________  EKG  None - EKG not ordered by ED physician ____________________________________________  RADIOLOGY   ED MD interpretation:  No acute abnormalities  Official radiology report(s): Dg Neck Soft Tissue  Result Date: 02/14/2018 CLINICAL DATA:  Neck pain.  Dysphagia. EXAM: NECK SOFT TISSUES - 1+ VIEW COMPARISON:  None. FINDINGS: There is no evidence of retropharyngeal soft tissue swelling or epiglottic enlargement. The cervical airway is unremarkable and no radio-opaque foreign body identified. IMPRESSION: Negative. Electronically Signed   By: Awilda Metro M.D.   On: 02/14/2018 06:25    ____________________________________________   PROCEDURES  Critical Care performed: No   Procedure(s) performed:    Procedures   ____________________________________________   INITIAL IMPRESSION / ASSESSMENT AND PLAN / ED COURSE  As part of my medical decision making, I reviewed the following data within the electronic MEDICAL RECORD NUMBER Nursing notes reviewed and incorporated and Radiograph reviewed     Differential diagnosis includes, but is not limited to, viral illness including viral pharyngitis, retropharyngeal infection, nonspecific soft tissue infection of the neck, peritonsillar abscess, Ludwig's angina, foreign body, epiglottitis.  Far and above my expectation is that the patient has a viral infection.  He is well-appearing, having no difficulty handling his secretions or speaking in a non-muffled voice, and he has a normal physical exam.  Given the description and the location of the pain, I will obtain soft tissue radiographs, but I anticipate that they will be within normal limits.  I already gave him my usual customary recommendations and return precautions anticipating that the radiographs are normal and he agrees with the plan.  Clinical Course as of Feb 14 642  Fri Feb 14, 2018  0981 Reassuring radiograph.  Will discharge with plan as described above DG Neck Soft Tissue [CF]    Clinical Course User Index [CF] Loleta Rose, MD    ____________________________________________  FINAL CLINICAL IMPRESSION(S) / ED DIAGNOSES  Final diagnoses:  Sore throat  Viral syndrome     MEDICATIONS GIVEN DURING THIS VISIT:  Medications - No data to display   ED Discharge Orders    None       Note:  This document was prepared using Dragon voice recognition software and may include unintentional dictation errors.    Loleta RoseForbach, Wash Nienhaus, MD 02/14/18 [phone]

## 2018-02-14 NOTE — ED Triage Notes (Signed)
Pt arrives to ED via POV from home with c/o sore throat x2 days. Pt denies N/V/D, no fever, no CP or SHOB. No OTC meds taken PTA.

## 2019-11-13 ENCOUNTER — Other Ambulatory Visit: Payer: Self-pay

## 2019-11-13 DIAGNOSIS — Z20822 Contact with and (suspected) exposure to covid-19: Secondary | ICD-10-CM

## 2019-11-16 LAB — NOVEL CORONAVIRUS, NAA: SARS-CoV-2, NAA: NOT DETECTED

## 2020-07-03 ENCOUNTER — Emergency Department
Admission: EM | Admit: 2020-07-03 | Discharge: 2020-07-03 | Disposition: A | Discharge status: Home / Self Care | Payer: Self-pay | Attending: Emergency Medicine | Admitting: Emergency Medicine

## 2020-07-03 ENCOUNTER — Other Ambulatory Visit: Payer: Self-pay

## 2020-07-03 ENCOUNTER — Encounter: Payer: Self-pay | Admitting: Emergency Medicine

## 2020-07-03 ENCOUNTER — Emergency Department: Payer: Self-pay

## 2020-07-03 DIAGNOSIS — Y999 Unspecified external cause status: Secondary | ICD-10-CM | POA: Insufficient documentation

## 2020-07-03 DIAGNOSIS — S0083XA Contusion of other part of head, initial encounter: Secondary | ICD-10-CM | POA: Insufficient documentation

## 2020-07-03 DIAGNOSIS — Y939 Activity, unspecified: Secondary | ICD-10-CM | POA: Insufficient documentation

## 2020-07-03 DIAGNOSIS — T07XXXA Unspecified multiple injuries, initial encounter: Secondary | ICD-10-CM

## 2020-07-03 DIAGNOSIS — S0081XA Abrasion of other part of head, initial encounter: Secondary | ICD-10-CM | POA: Insufficient documentation

## 2020-07-03 DIAGNOSIS — Y929 Unspecified place or not applicable: Secondary | ICD-10-CM | POA: Insufficient documentation
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
## 2020-07-03 NOTE — Discharge Instructions (Addendum)
Your workup in the Emergency Department today was reassuring.  We did not find any specific abnormalities.  We recommend you drink plenty of fluids, take your regular medications and/or any new ones prescribed today, and follow up with the doctor(s) listed in these documents as recommended.  Return to the Emergency Department if you develop new or worsening symptoms that concern you. But
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
## 2021-01-05 ENCOUNTER — Ambulatory Visit: Payer: Self-pay
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
